# Patient Record
Sex: Female | Born: 1960 | Race: White | Hispanic: No | Marital: Married | Smoking: Former smoker
Health system: Southern US, Community
[De-identification: ages and names within clinical notes are randomized; demographics above are authoritative.]

## PROBLEM LIST (undated history)

## (undated) DIAGNOSIS — M797 Fibromyalgia: Secondary | ICD-10-CM

## (undated) DIAGNOSIS — I10 Essential (primary) hypertension: Secondary | ICD-10-CM

## (undated) DIAGNOSIS — F329 Major depressive disorder, single episode, unspecified: Secondary | ICD-10-CM

## (undated) DIAGNOSIS — E7800 Pure hypercholesterolemia, unspecified: Secondary | ICD-10-CM

## (undated) DIAGNOSIS — M1990 Unspecified osteoarthritis, unspecified site: Secondary | ICD-10-CM

## (undated) DIAGNOSIS — F32A Depression, unspecified: Secondary | ICD-10-CM

## (undated) DIAGNOSIS — C801 Malignant (primary) neoplasm, unspecified: Secondary | ICD-10-CM

## (undated) HISTORY — PX: ABDOMINAL HYSTERECTOMY: SHX81

## (undated) HISTORY — PX: WRIST FUSION WITH ILIAC CREST BONE GRAFT: SHX5682

## (undated) HISTORY — PX: ECTOPIC PREGNANCY SURGERY: SHX613

## (undated) HISTORY — PX: BACK SURGERY: SHX140

---

## 2001-10-17 ENCOUNTER — Encounter: Payer: Self-pay | Admitting: Specialist

## 2001-10-18 ENCOUNTER — Inpatient Hospital Stay (HOSPITAL_COMMUNITY): Admission: RE | Admit: 2001-10-18 | Discharge: 2001-10-19 | Payer: Self-pay | Admitting: Specialist

## 2012-07-01 ENCOUNTER — Emergency Department (HOSPITAL_COMMUNITY): Payer: BC Managed Care – PPO

## 2012-07-01 ENCOUNTER — Encounter (HOSPITAL_COMMUNITY): Payer: Self-pay | Admitting: Emergency Medicine

## 2012-07-01 ENCOUNTER — Emergency Department (HOSPITAL_COMMUNITY)
Admission: EM | Admit: 2012-07-01 | Discharge: 2012-07-01 | Disposition: A | Discharge status: Home / Self Care | Payer: BC Managed Care – PPO | Attending: Emergency Medicine | Admitting: Emergency Medicine

## 2012-07-01 DIAGNOSIS — Z8659 Personal history of other mental and behavioral disorders: Secondary | ICD-10-CM | POA: Insufficient documentation

## 2012-07-01 DIAGNOSIS — Z7982 Long term (current) use of aspirin: Secondary | ICD-10-CM | POA: Insufficient documentation

## 2012-07-01 DIAGNOSIS — M129 Arthropathy, unspecified: Secondary | ICD-10-CM | POA: Insufficient documentation

## 2012-07-01 DIAGNOSIS — M79642 Pain in left hand: Secondary | ICD-10-CM

## 2012-07-01 DIAGNOSIS — I10 Essential (primary) hypertension: Secondary | ICD-10-CM | POA: Insufficient documentation

## 2012-07-01 DIAGNOSIS — Z85830 Personal history of malignant neoplasm of bone: Secondary | ICD-10-CM | POA: Insufficient documentation

## 2012-07-01 DIAGNOSIS — S6990XA Unspecified injury of unspecified wrist, hand and finger(s), initial encounter: Secondary | ICD-10-CM | POA: Insufficient documentation

## 2012-07-01 DIAGNOSIS — Y9389 Activity, other specified: Secondary | ICD-10-CM | POA: Insufficient documentation

## 2012-07-01 DIAGNOSIS — Z8639 Personal history of other endocrine, nutritional and metabolic disease: Secondary | ICD-10-CM | POA: Insufficient documentation

## 2012-07-01 DIAGNOSIS — M545 Low back pain: Secondary | ICD-10-CM

## 2012-07-01 DIAGNOSIS — S0990XA Unspecified injury of head, initial encounter: Secondary | ICD-10-CM | POA: Insufficient documentation

## 2012-07-01 DIAGNOSIS — Z79899 Other long term (current) drug therapy: Secondary | ICD-10-CM | POA: Insufficient documentation

## 2012-07-01 DIAGNOSIS — IMO0002 Reserved for concepts with insufficient information to code with codable children: Secondary | ICD-10-CM | POA: Insufficient documentation

## 2012-07-01 DIAGNOSIS — S0993XA Unspecified injury of face, initial encounter: Secondary | ICD-10-CM | POA: Insufficient documentation

## 2012-07-01 DIAGNOSIS — Z87891 Personal history of nicotine dependence: Secondary | ICD-10-CM | POA: Insufficient documentation

## 2012-07-01 DIAGNOSIS — Y92410 Unspecified street and highway as the place of occurrence of the external cause: Secondary | ICD-10-CM | POA: Insufficient documentation

## 2012-07-01 DIAGNOSIS — M542 Cervicalgia: Secondary | ICD-10-CM

## 2012-07-01 DIAGNOSIS — S199XXA Unspecified injury of neck, initial encounter: Secondary | ICD-10-CM | POA: Insufficient documentation

## 2012-07-01 DIAGNOSIS — Z862 Personal history of diseases of the blood and blood-forming organs and certain disorders involving the immune mechanism: Secondary | ICD-10-CM | POA: Insufficient documentation

## 2012-07-01 HISTORY — DX: Depression, unspecified: F32.A

## 2012-07-01 HISTORY — DX: Major depressive disorder, single episode, unspecified: F32.9

## 2012-07-01 HISTORY — DX: Pure hypercholesterolemia, unspecified: E78.00

## 2012-07-01 HISTORY — DX: Malignant (primary) neoplasm, unspecified: C80.1

## 2012-07-01 HISTORY — DX: Essential (primary) hypertension: I10

## 2012-07-01 HISTORY — DX: Unspecified osteoarthritis, unspecified site: M19.90

## 2012-07-01 MED ORDER — KETOROLAC TROMETHAMINE 60 MG/2ML IM SOLN
60.0000 mg | Freq: Once | INTRAMUSCULAR | Status: AC
  Administered 2012-07-01: 60 mg via INTRAMUSCULAR
  Filled 2012-07-01: qty 2

## 2012-07-01 MED ORDER — CYCLOBENZAPRINE HCL 10 MG PO TABS: 10.0000 mg | ORAL_TABLET | Freq: Two times a day (BID) | ORAL | Status: DC | PRN
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
## 2012-07-01 NOTE — ED Notes (Signed)
Pt. Stable. Discharge instructions reviewed. Verbalized understanding. Family at bedside.
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
## 2012-07-01 NOTE — Progress Notes (Signed)
Orthopedic Tech Progress Note Patient Details:  Rebecca Haynes [facility] [birth year] [id number]  Ortho Devices Type of Ortho Device: Ace wrap;Volar splint Ortho Device/Splint Location: LUE Ortho Device/Splint Interventions: Ordered;Application   Rebecca Haynes 07/01/2012, 10:20 PM

## 2012-07-01 NOTE — ED Notes (Signed)
Paged ortho 

## 2012-07-02 NOTE — ED Provider Notes (Signed)
Medical screening examination/treatment/procedure(s) were performed by non-physician practitioner and as supervising physician I was immediately available for consultation/collaboration.   Richardean Canal, MD 07/02/12 1450

## 2012-07-04 ENCOUNTER — Encounter (HOSPITAL_BASED_OUTPATIENT_CLINIC_OR_DEPARTMENT_OTHER): Payer: Self-pay | Admitting: *Deleted

## 2012-07-04 ENCOUNTER — Emergency Department (HOSPITAL_BASED_OUTPATIENT_CLINIC_OR_DEPARTMENT_OTHER)
Admission: EM | Admit: 2012-07-04 | Discharge: 2012-07-04 | Disposition: A | Discharge status: Home / Self Care | Payer: BC Managed Care – PPO | Attending: Emergency Medicine | Admitting: Emergency Medicine

## 2012-07-04 DIAGNOSIS — F3289 Other specified depressive episodes: Secondary | ICD-10-CM | POA: Insufficient documentation

## 2012-07-04 DIAGNOSIS — M129 Arthropathy, unspecified: Secondary | ICD-10-CM | POA: Insufficient documentation

## 2012-07-04 DIAGNOSIS — R51 Headache: Secondary | ICD-10-CM | POA: Insufficient documentation

## 2012-07-04 DIAGNOSIS — Z8589 Personal history of malignant neoplasm of other organs and systems: Secondary | ICD-10-CM | POA: Insufficient documentation

## 2012-07-04 DIAGNOSIS — Z79899 Other long term (current) drug therapy: Secondary | ICD-10-CM | POA: Insufficient documentation

## 2012-07-04 DIAGNOSIS — F329 Major depressive disorder, single episode, unspecified: Secondary | ICD-10-CM | POA: Insufficient documentation

## 2012-07-04 DIAGNOSIS — E7800 Pure hypercholesterolemia, unspecified: Secondary | ICD-10-CM | POA: Insufficient documentation

## 2012-07-04 DIAGNOSIS — I10 Essential (primary) hypertension: Secondary | ICD-10-CM | POA: Insufficient documentation

## 2012-07-04 DIAGNOSIS — Z87891 Personal history of nicotine dependence: Secondary | ICD-10-CM | POA: Insufficient documentation

## 2012-07-04 MED ORDER — METOCLOPRAMIDE HCL 5 MG/ML IJ SOLN
10.0000 mg | Freq: Once | INTRAMUSCULAR | Status: AC
  Administered 2012-07-04: 10 mg via INTRAMUSCULAR
  Filled 2012-07-04: qty 2

## 2012-07-04 MED ORDER — DIAZEPAM 5 MG/ML IJ SOLN
5.0000 mg | Freq: Once | INTRAMUSCULAR | Status: AC
  Administered 2012-07-04: 14:00:00 via INTRAMUSCULAR
  Filled 2012-07-04: qty 2

## 2012-07-04 MED ORDER — KETOROLAC TROMETHAMINE 60 MG/2ML IM SOLN
60.0000 mg | Freq: Once | INTRAMUSCULAR | Status: AC
  Administered 2012-07-04: 60 mg via INTRAMUSCULAR
  Filled 2012-07-04: qty 2

## 2012-07-04 MED ORDER — DIPHENHYDRAMINE HCL 50 MG/ML IJ SOLN
25.0000 mg | Freq: Once | INTRAMUSCULAR | Status: AC
  Administered 2012-07-04: 25 mg via INTRAMUSCULAR
  Filled 2012-07-04: qty 1
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
## 2012-07-04 NOTE — ED Provider Notes (Signed)
Medical screening examination/treatment/procedure(s) were performed by non-physician practitioner and as supervising physician I was immediately available for consultation/collaboration.  Gilda Crease, MD 07/04/12 [phone]
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
## 2016-02-09 ENCOUNTER — Emergency Department (HOSPITAL_BASED_OUTPATIENT_CLINIC_OR_DEPARTMENT_OTHER)
Admission: EM | Admit: 2016-02-09 | Discharge: 2016-02-09 | Disposition: A | Discharge status: Home / Self Care | Payer: BLUE CROSS/BLUE SHIELD | Attending: Dermatology | Admitting: Dermatology

## 2016-02-09 ENCOUNTER — Encounter (HOSPITAL_BASED_OUTPATIENT_CLINIC_OR_DEPARTMENT_OTHER): Payer: Self-pay | Admitting: *Deleted

## 2016-02-09 DIAGNOSIS — G43909 Migraine, unspecified, not intractable, without status migrainosus: Secondary | ICD-10-CM | POA: Diagnosis not present

## 2016-02-09 DIAGNOSIS — Z87891 Personal history of nicotine dependence: Secondary | ICD-10-CM | POA: Diagnosis not present

## 2016-02-09 DIAGNOSIS — I10 Essential (primary) hypertension: Secondary | ICD-10-CM | POA: Diagnosis not present

## 2016-02-09 DIAGNOSIS — Z79899 Other long term (current) drug therapy: Secondary | ICD-10-CM | POA: Insufficient documentation

## 2016-02-09 DIAGNOSIS — Z5321 Procedure and treatment not carried out due to patient leaving prior to being seen by health care provider: Secondary | ICD-10-CM | POA: Diagnosis not present

## 2016-02-09 DIAGNOSIS — Z859 Personal history of malignant neoplasm, unspecified: Secondary | ICD-10-CM | POA: Diagnosis not present

## 2016-02-09 DIAGNOSIS — F419 Anxiety disorder, unspecified: Secondary | ICD-10-CM | POA: Diagnosis present

## 2016-02-09 HISTORY — DX: Fibromyalgia: M79.7

## 2016-02-09 NOTE — ED Triage Notes (Signed)
Pt reports she had anxiety last night, so she took a buspar, "it gave me a migraine". Pt is teary at triage, states she has chills also.

## 2016-02-09 NOTE — ED Notes (Signed)
Notified by reg that pt left
